# Patient Record
Sex: Male | Born: 1985 | Race: Black or African American | Hispanic: No | Marital: Single | Smoking: Current every day smoker
Health system: Southern US, Community
[De-identification: ages and names within clinical notes are randomized; demographics above are authoritative.]

## PROBLEM LIST (undated history)

## (undated) DIAGNOSIS — Q249 Congenital malformation of heart, unspecified: Secondary | ICD-10-CM

## (undated) HISTORY — PX: OTHER SURGICAL HISTORY: SHX169

---

## 2005-05-09 ENCOUNTER — Emergency Department: Payer: Self-pay | Admitting: General Practice

## 2005-05-14 ENCOUNTER — Emergency Department: Payer: Self-pay | Admitting: Emergency Medicine

## 2006-10-18 IMAGING — CR ORBITS - COMPLETE 4+ VIEW
1 series · 4 of 4 positions shown · non-contrast
Comparison: none

REASON FOR EXAM: left eye laceration
COMMENTS:

PROCEDURE:     DXR - DXR ORBITS COMPLETE  - May 10, 2005 [DATE]
RESULT:     The patient sustained laceration around the LEFT eye.
Four views of the orbits reveal no evidence of a retained foreign body. The
bony structures are grossly normal but the films are somewhat
underpenetrated.

[Series 1: view not recorded · 0.17mm/px · 4 of 4 slices shown]
[im 1/4]
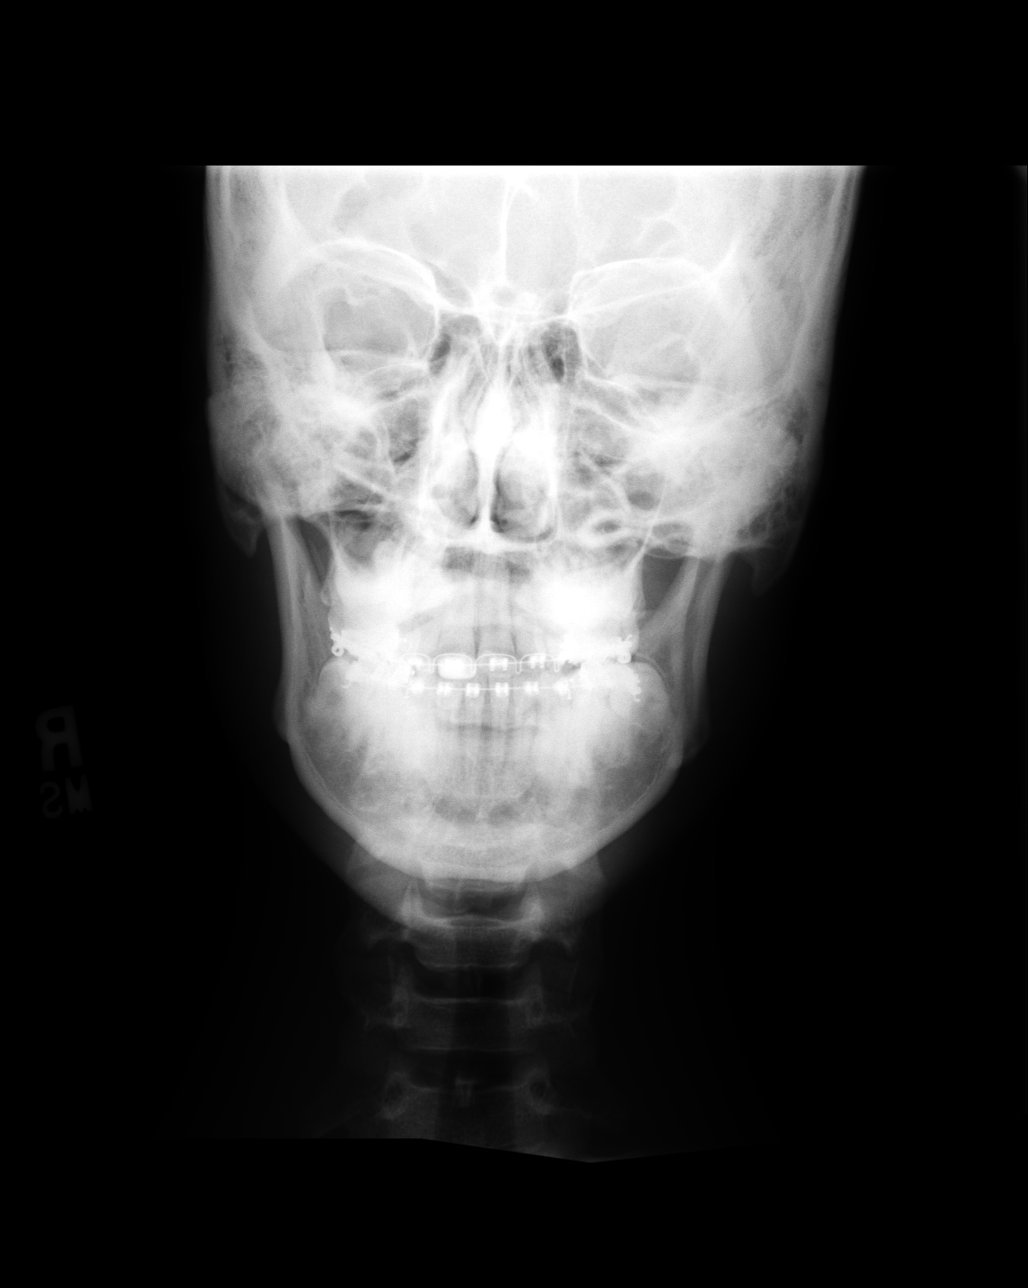
[im 2/4]
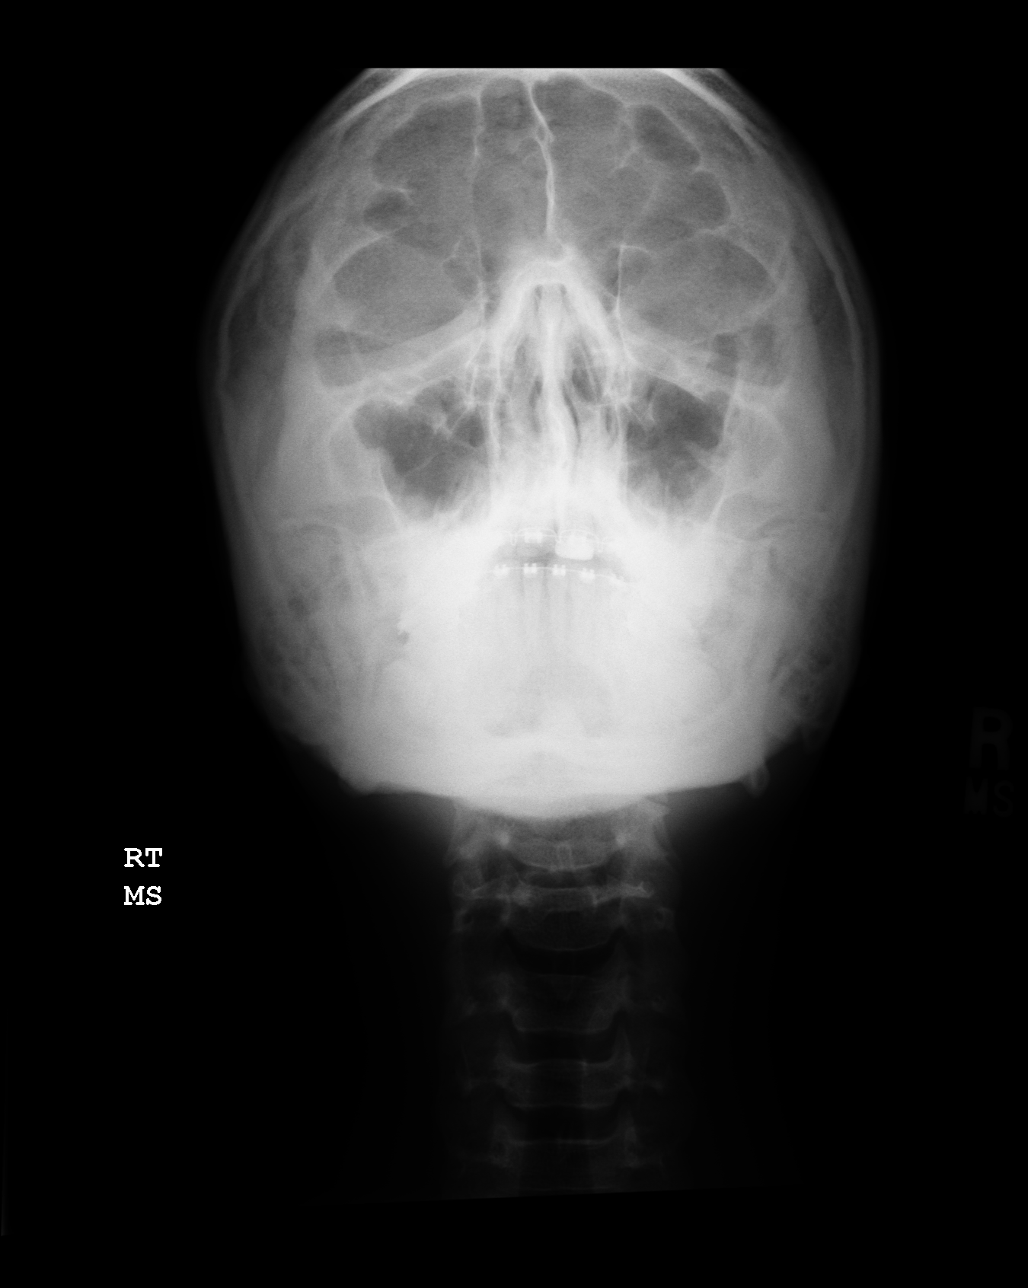
[im 3/4]
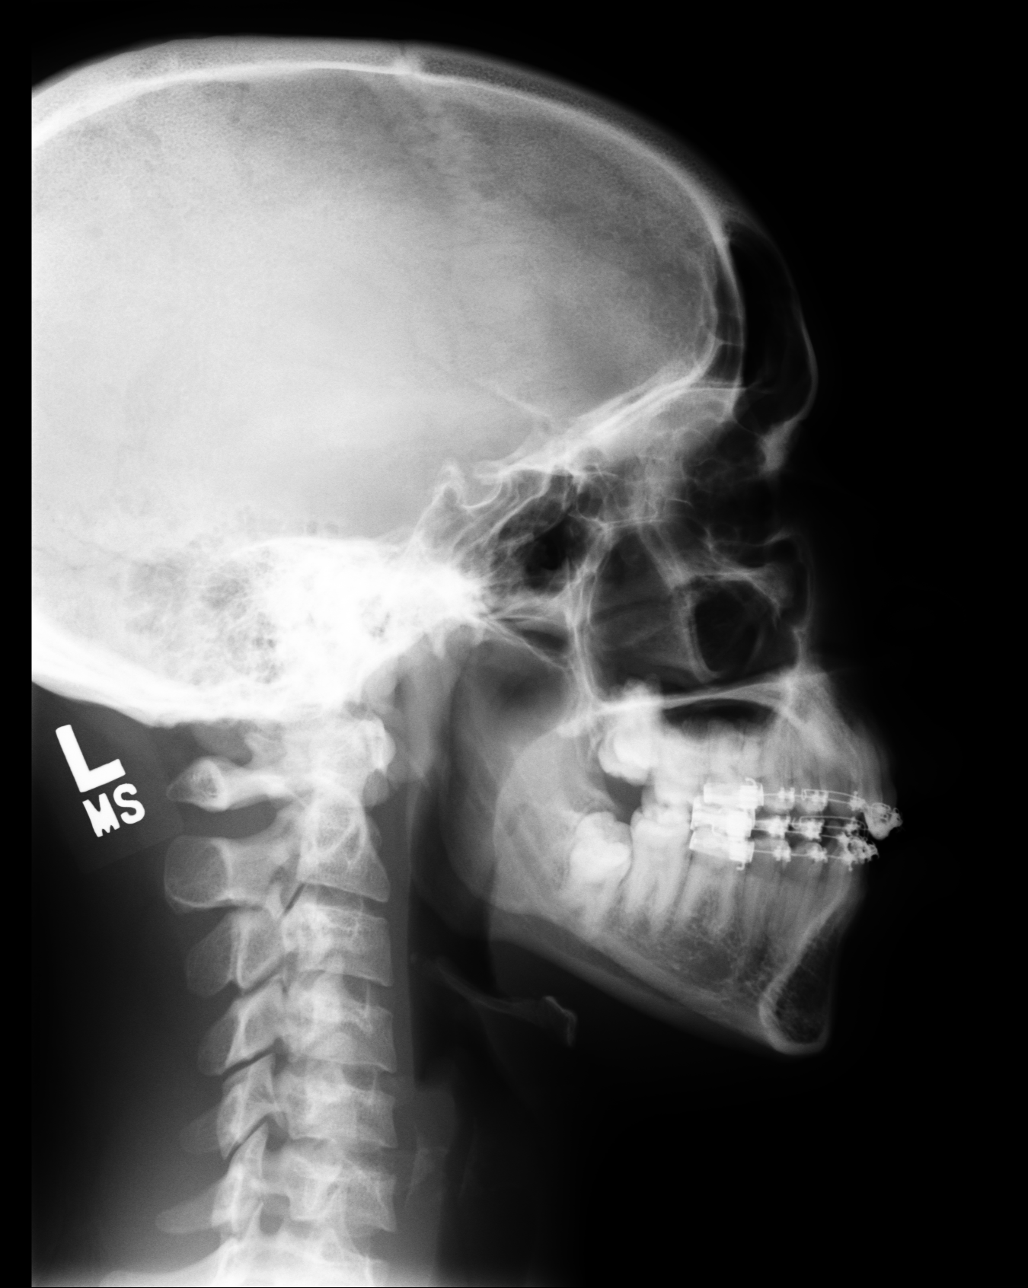
[im 4/4]
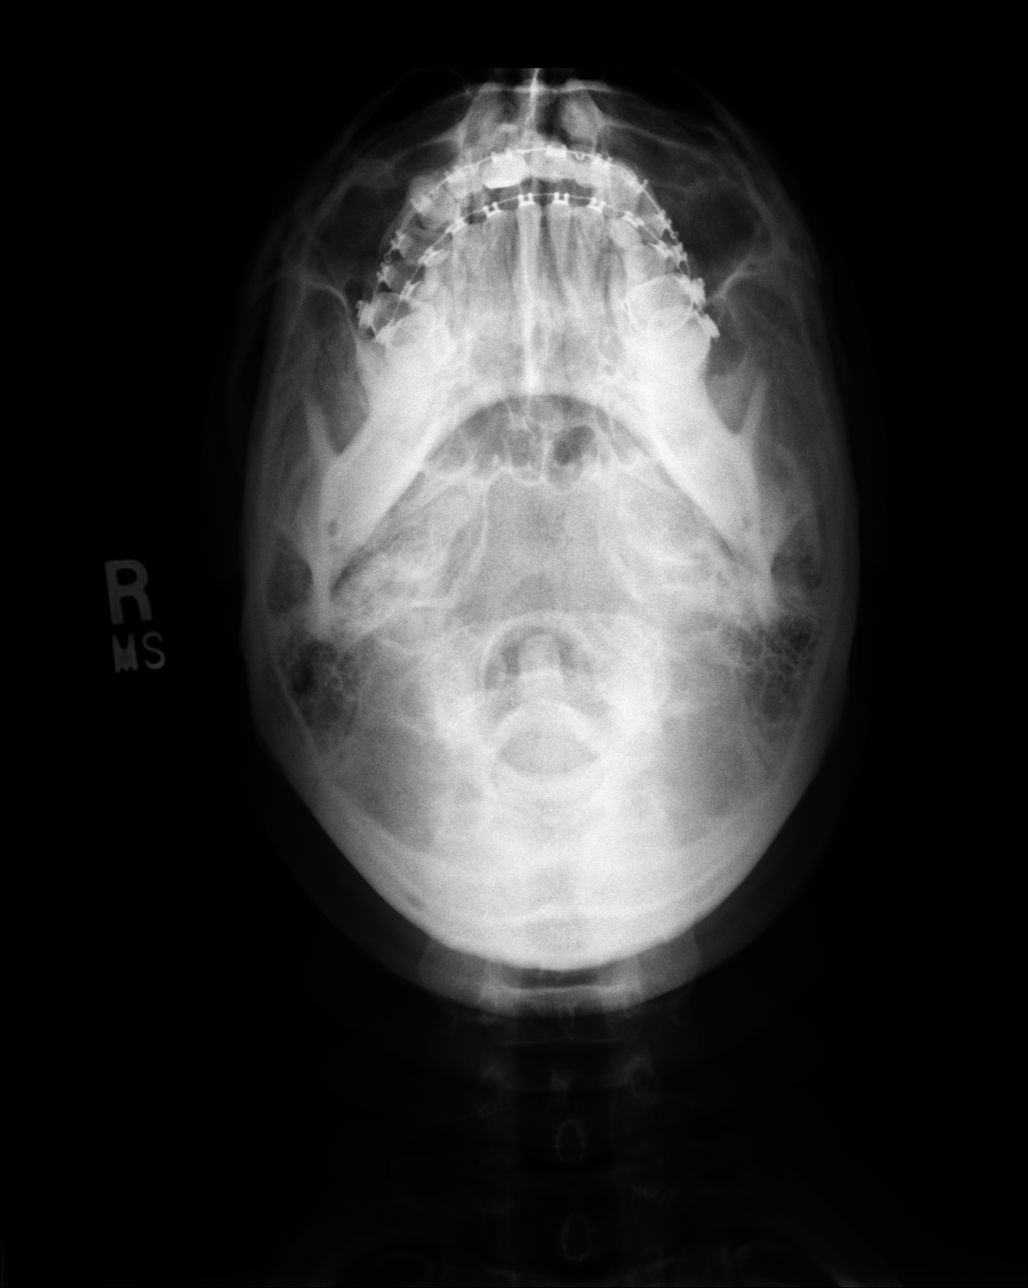

[4 of 4 positions shown; findings below may reference images not displayed]

IMPRESSION: 1)I do not see objective evidence of a fracture around the eyes nor evidence
of retained foreign bodies.  Further evaluation with CT scanning is
available if there are clinical findings which suggest an occult fracture.

## 2008-03-03 ENCOUNTER — Emergency Department: Payer: Self-pay | Admitting: Emergency Medicine

## 2013-03-22 ENCOUNTER — Emergency Department: Payer: Self-pay | Admitting: Emergency Medicine

## 2014-08-30 IMAGING — CR LEFT WRIST - COMPLETE 3+ VIEW
1 series · 4 of 4 positions shown · non-contrast
Comparison: none

REASON FOR EXAM: fall onto outstretched left hand
COMMENTS:

[Series 1: pa · 0.17mm/px · 4 of 4 slices shown]
[im 1/4]
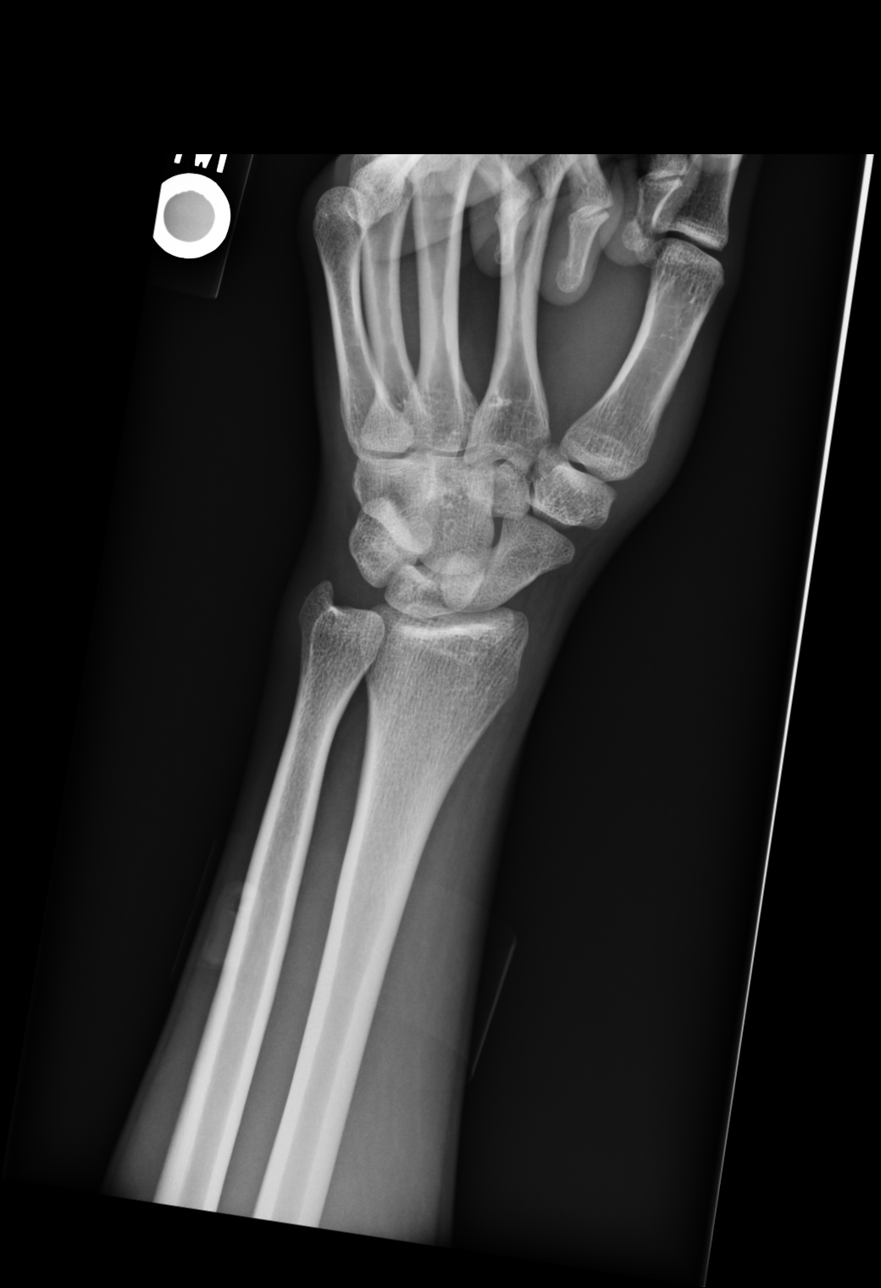
[im 2/4]
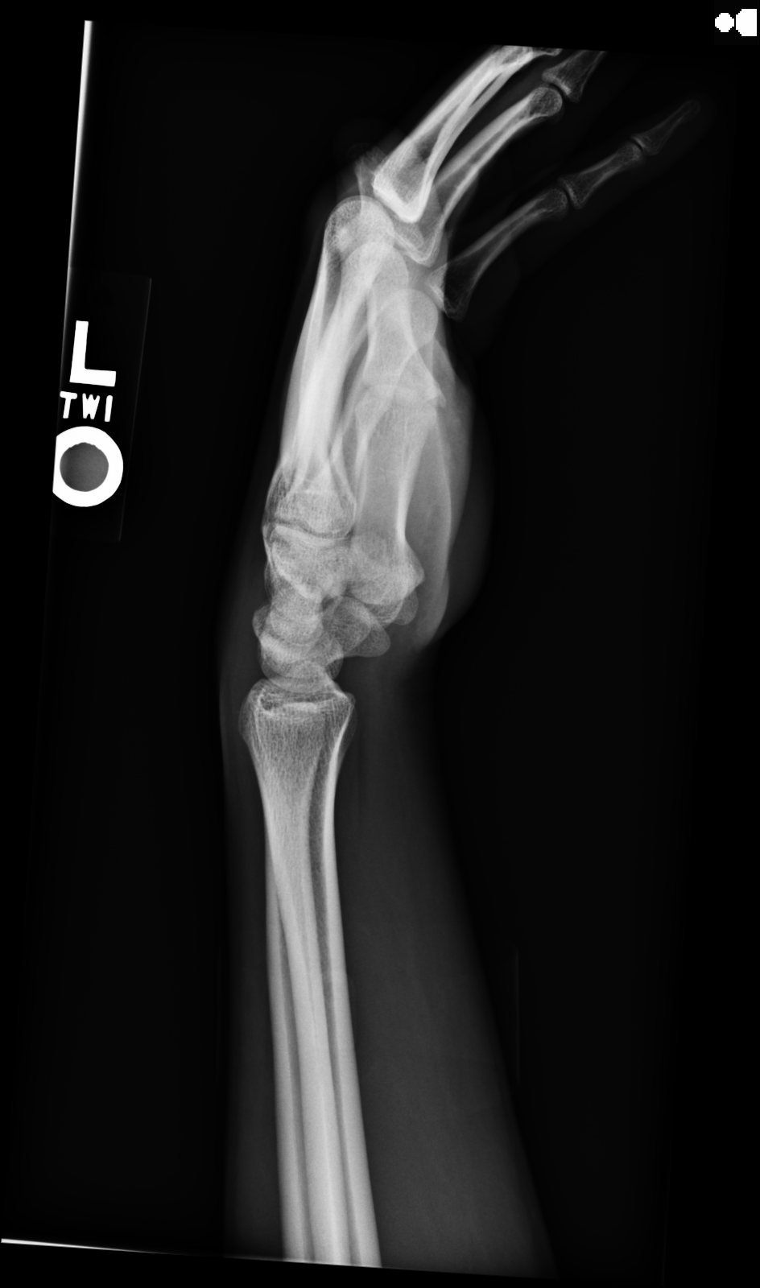
[im 3/4]
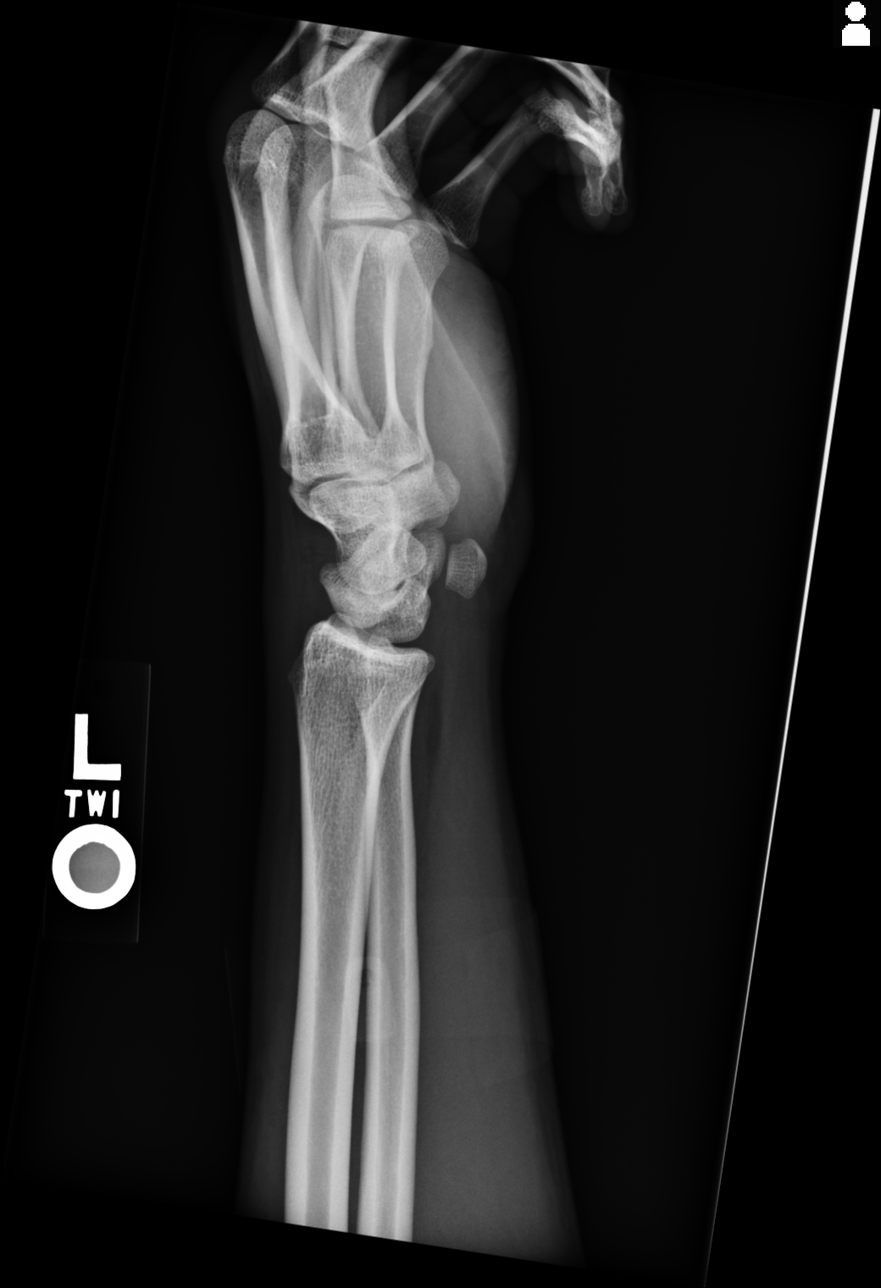
[im 4/4]
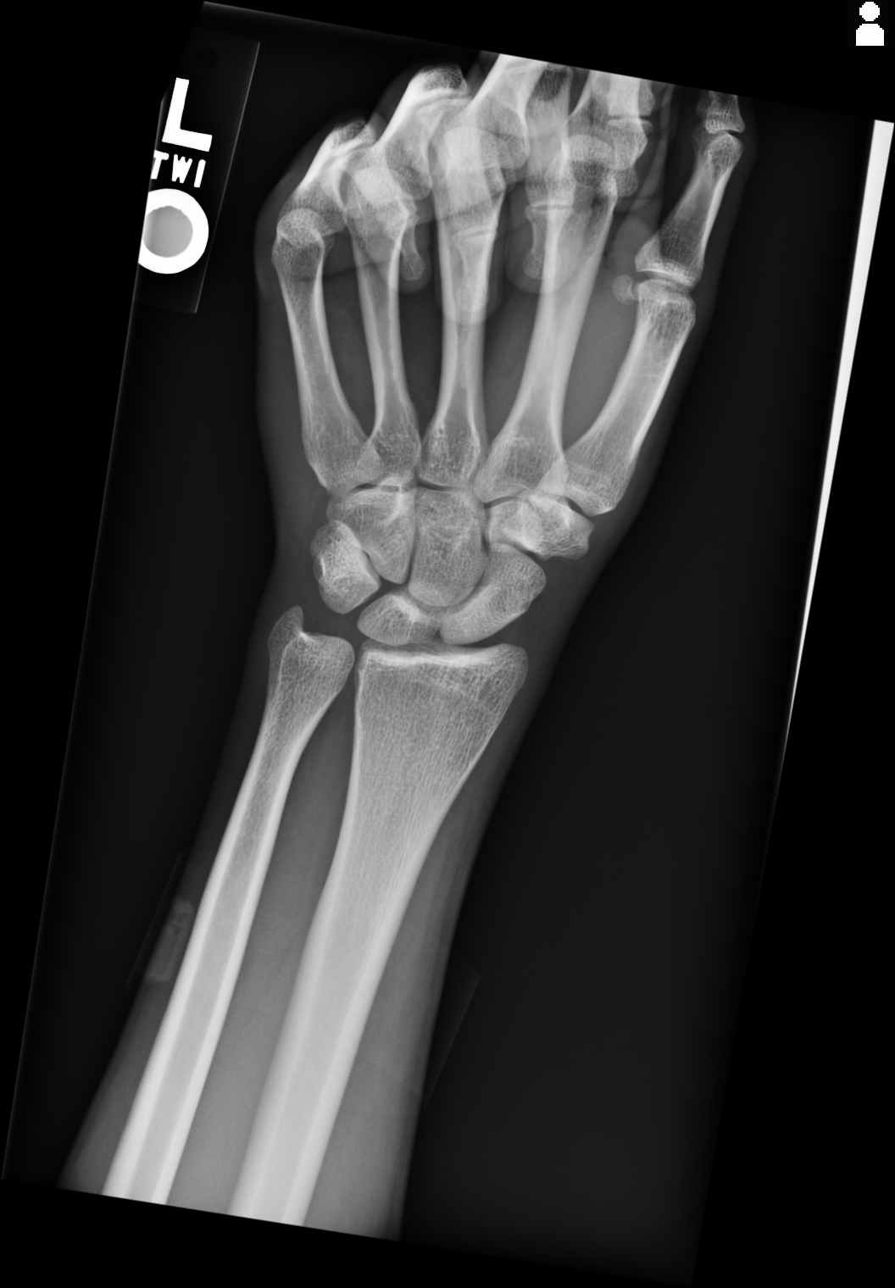

[4 of 4 positions shown; findings below may reference images not displayed]

PROCEDURE:     DXR - DXR WRIST LT COMP WITH OBLIQUES  - March 22, 2013  [DATE]

RESULT:     Four views of the left wrist reveal the bones to be adequately
mineralized. There is no evidence of an acute fracture or dislocation. No
significant degenerative change is demonstrated. The distal radius and ulna
appear normal. The overlying soft tissues are normal in appearance.
IMPRESSION: There is no acute bony abnormality of the left wrist.

If the patient's symptoms persist and remain unexplained, further evaluation
with MRI may be useful.

[REDACTED]

## 2016-05-12 ENCOUNTER — Encounter: Payer: Self-pay | Admitting: Emergency Medicine

## 2016-05-12 ENCOUNTER — Emergency Department
Admission: EM | Admit: 2016-05-12 | Discharge: 2016-05-12 | Disposition: A | Discharge status: Home / Self Care | Payer: Self-pay | Attending: Emergency Medicine | Admitting: Emergency Medicine

## 2016-05-12 DIAGNOSIS — H6123 Impacted cerumen, bilateral: Secondary | ICD-10-CM | POA: Insufficient documentation

## 2016-05-12 DIAGNOSIS — F17210 Nicotine dependence, cigarettes, uncomplicated: Secondary | ICD-10-CM | POA: Insufficient documentation

## 2016-05-12 HISTORY — DX: Congenital malformation of heart, unspecified: Q24.9

## 2016-05-12 MED ORDER — CARBAMIDE PEROXIDE 6.5 % OT SOLN: 5.0000 [drp] | Freq: Two times a day (BID) | OTIC | Status: DC

## 2016-05-12 NOTE — Discharge Instructions (Signed)
Cerumen Impaction The structures of the external ear canal secrete a waxy substance known as cerumen. Excess cerumen can build up in the ear canal, causing a condition known as cerumen impaction. Cerumen impaction can cause ear pain and disrupt the function of the ear. The rate of cerumen production differs for each individual. In certain individuals, the configuration of the ear canal may decrease his or her ability to naturally remove cerumen. CAUSES Cerumen impaction is caused by excessive cerumen production or buildup. RISK FACTORS  Frequent use of swabs to clean ears.  Having narrow ear canals.  Having eczema.  Being dehydrated. SIGNS AND SYMPTOMS  Diminished hearing.  Ear drainage.  Ear pain.  Ear itch. TREATMENT Treatment may involve:  Over-the-counter or prescription ear drops to soften the cerumen.  Removal of cerumen by a health care provider. This may be done with:  Irrigation with warm water. This is the most common method of removal.  Ear curettes and other instruments.  Surgery. This may be done in severe cases. HOME CARE INSTRUCTIONS  Take medicines only as directed by your health care provider.  Do not insert objects into the ear with the intent of cleaning the ear. PREVENTION  Do not insert objects into the ear, even with the intent of cleaning the ear. Removing cerumen as a part of normal hygiene is not necessary, and the use of swabs in the ear canal is not recommended.  Drink enough water to keep your urine clear or pale yellow.  Control your eczema if you have it. SEEK MEDICAL CARE IF:  You develop ear pain.  You develop bleeding from the ear.  The cerumen does not clear after you use ear drops as directed.   This information is not intended to replace advice given to you by your health care provider. Make sure you discuss any questions you have with your health care provider.   Document Released: 11/27/2004 Document Revised: 11/10/2014  Document Reviewed: 06/06/2015 Elsevier Interactive Patient Education 2016 [employer]  

## 2016-05-12 NOTE — ED Notes (Signed)
See triage note  States he felt like he has water or wax build up in both ears for the past couple of days

## 2016-05-12 NOTE — ED Provider Notes (Signed)
[facility] Emergency Department Provider Note   ____________________________________________  Time seen: Approximately 12:42 PM  I have reviewed the triage vital signs and the nursing notes.   HISTORY  Chief Complaint Hearing Problem    HPI Ryan Frye is a 30 y.o. male patient complaining of bilateral hearing loss since yesterday. Patient states "feeling like water is in his ears".Patient denies any URI signs and symptoms. Patient denies pain with this complaint. No palliative measures for his complaint.   Past Medical History  Diagnosis Date  . Congenital heart defect     There are no active problems to display for this patient.   Past Surgical History  Procedure Laterality Date  . Open heart surgery      No current outpatient prescriptions on file.  Allergies Review of patient's allergies indicates no known allergies.  No family history on file.  Social History Social History  Substance Use Topics  . Smoking status: Current Every Day Smoker -- 0.50 packs/day    Types: Cigarettes  . Smokeless tobacco: None  . Alcohol Use: No    Review of Systems Constitutional: No fever/chills Eyes: No visual changes. ENT: No sore throat.  Bilateral hearing loss Cardiovascular: Denies chest pain. Respiratory: Denies shortness of breath. Gastrointestinal: No abdominal pain.  No nausea, no vomiting.  No diarrhea.  No constipation. Genitourinary: Negative for dysuria. Musculoskeletal: Negative for back pain. Skin: Negative for rash. Neurological: Negative for headaches, focal weakness or numbness.   ____________________________________________   PHYSICAL EXAM:  VITAL SIGNS: ED Triage Vitals  Enc Vitals Group     BP 05/12/16 1231 129/79 mmHg     Pulse Rate 05/12/16 1231 93     Resp 05/12/16 1231 20     Temp 05/12/16 1231 98.4 F (36.9 C)     Temp Source 05/12/16 1231 Oral     SpO2 05/12/16 1231 94 %     Weight 05/12/16 1231  175 lb (79.379 kg)     Height 05/12/16 1231 5\' 6"  (1.676 m)     Head Cir --      Peak Flow --      Pain Score --      Pain Loc --      Pain Edu? --      Excl. in GC? --     Constitutional: Alert and oriented. Well appearing and in no acute distress. Eyes: Conjunctivae are normal. PERRL. EOMI. Head: Atraumatic. Nose: No congestion/rhinnorhea. EARS: Canals full of cerumen. TMs not visible Mouth/Throat: Mucous membranes are moist.  Oropharynx non-erythematous. Neck: No stridor.  No cervical spine tenderness to palpation. Hematological/Lymphatic/Immunilogical: No cervical lymphadenopathy. Cardiovascular: Normal rate, regular rhythm. Grossly normal heart sounds.  Good peripheral circulation. Respiratory: Normal respiratory effort.  No retractions. Lungs CTAB. Gastrointestinal: Soft and nontender. No distention. No abdominal bruits. No CVA tenderness. Musculoskeletal: No lower extremity tenderness nor edema.  No joint effusions. Neurologic:  Normal speech and language. No gross focal neurologic deficits are appreciated. No gait instability. Skin:  Skin is warm, dry and intact. No rash noted. Psychiatric: Mood and affect are normal. Speech and behavior are normal.  ____________________________________________   LABS (all labs ordered are listed, but only abnormal results are displayed)  Labs Reviewed - No data to display ____________________________________________  EKG   ____________________________________________  RADIOLOGY   ____________________________________________   PROCEDURES  Procedure(s) performed: None  Procedures  Critical Care performed: No  ____________________________________________   INITIAL IMPRESSION / ASSESSMENT AND PLAN / ED COURSE  Pertinent  labs & imaging results that were available during my care of the patient were reviewed by me and considered in my medical decision making (see chart for details).  Cerumen impaction. Patient bilateral  irrigation to remove cerumen. Patient given discharge care instruction and [facility].Patient given prescription for Debrox ear drops. ____________________________________________   FINAL CLINICAL IMPRESSION(S) / ED DIAGNOSES  Final diagnoses:  Cerumen impaction, bilateral      NEW MEDICATIONS STARTED DURING THIS VISIT:  New Prescriptions   No medications on file     Note:  This document was prepared using Dragon voice recognition software and may include unintentional dictation errors.    Joni Reining, PA-C 05/12/16 1329  Joni Reining, PA-C 05/12/16 1411  Jene Every, MD 05/12/16 [phone]

## 2016-05-12 NOTE — ED Notes (Signed)
States has had decreased hearing since yesterday, L worse than R, states "fells like I have water in my ears"

## 2017-02-19 ENCOUNTER — Emergency Department
Admission: EM | Admit: 2017-02-19 | Discharge: 2017-02-19 | Disposition: A | Discharge status: Home / Self Care | Payer: Self-pay | Attending: Emergency Medicine | Admitting: Emergency Medicine

## 2017-02-19 DIAGNOSIS — F17210 Nicotine dependence, cigarettes, uncomplicated: Secondary | ICD-10-CM | POA: Insufficient documentation

## 2017-02-19 DIAGNOSIS — H1031 Unspecified acute conjunctivitis, right eye: Secondary | ICD-10-CM | POA: Insufficient documentation

## 2017-02-19 DIAGNOSIS — Z79899 Other long term (current) drug therapy: Secondary | ICD-10-CM | POA: Insufficient documentation

## 2017-02-19 MED ORDER — ACETAMINOPHEN 325 MG PO TABS
650.0000 mg | ORAL_TABLET | Freq: Once | ORAL | Status: AC
  Administered 2017-02-19: 650 mg via ORAL

## 2017-02-19 MED ORDER — ERYTHROMYCIN 5 MG/GM OP OINT: TOPICAL_OINTMENT | Freq: Three times a day (TID) | OPHTHALMIC | 0 refills | Status: AC

## 2017-02-19 NOTE — ED Triage Notes (Signed)
Pt c/o right eye irritation today with drainage.Ryan Frye

## 2017-02-19 NOTE — ED Provider Notes (Signed)
[facility] Emergency Department Provider Note  ____________________________________________  Time seen: Approximately 3:30 PM  I have reviewed the triage vital signs and the nursing notes.   HISTORY  Chief Complaint Eye Problem    HPI Ryan Frye is a 31 y.o. male that presents to the emergency department with a red eye and excessive clear drainage since this morning. Patient states that he woke up this morning with the red eye. He also states that his cheek is hurting near his eye and he has a mild headache.He does not wear contacts or glasses and does not see an eye doctor. He has never had problems with his eyes in the past. No trauma. He denies fever, visual changes, double vision, floaters, flashers, photophobia, eye pain, shortness of breath, chest pain, nausea, vomiting.   Past Medical History:  Diagnosis Date  . Congenital heart defect     There are no active problems to display for this patient.   Past Surgical History:  Procedure Laterality Date  . open heart surgery      Prior to Admission medications   Medication Sig Start Date End Date Taking? Authorizing Provider  carbamide peroxide (DEBROX) 6.5 % otic solution Place 5 drops into the right ear 2 (two) times daily. 05/12/16   Joni Reining, PA-C  erythromycin [facility]) ophthalmic ointment Place into the right eye 3 (three) times daily. Place a 1/2 inch ribbon of ointment into the lower eyelid. 02/19/17 03/01/17  Enid Derry, PA-C    Allergies Patient has no known allergies.  No family history on file.  Social History Social History  Substance Use Topics  . Smoking status: Current Every Day Smoker    Packs/day: 0.50    Types: Cigarettes  . Smokeless tobacco: Never Used  . Alcohol use No     Review of Systems  Constitutional: No fever/chills ENT: No upper respiratory complaints. Cardiovascular: No chest pain. Respiratory: No cough. No SOB. Gastrointestinal:  No  nausea, no vomiting.    ____________________________________________   PHYSICAL EXAM:  VITAL SIGNS: ED Triage Vitals  Enc Vitals Group     BP 02/19/17 1440 129/72     Pulse Rate 02/19/17 1440 81     Resp 02/19/17 1440 18     Temp 02/19/17 1440 98.8 F (37.1 C)     Temp Source 02/19/17 1440 Oral     SpO2 02/19/17 1440 98 %     Weight 02/19/17 1440 165 lb (74.8 kg)     Height 02/19/17 1440  (1.676 m)     Head Circumference --      Peak Flow --      Pain Score 02/19/17 1431 3     Pain Loc --      Pain Edu? --      Excl. in GC? --      Constitutional: Alert and oriented. Well appearing and in no acute distress. Eyes: Conjunctiva is injected. Excess clear drainage in right eye. PERRL. EOMI. Head: Atraumatic. No temporal tenderness. ENT:      Ears: Tympanic membranes are occluded with cerumen.      Nose: No congestion/rhinnorhea. No sinus tenderness.      Mouth/Throat: Mucous membranes are moist.  Neck: No stridor.  Cardiovascular: Normal rate, regular rhythm.  Good peripheral circulation. Respiratory: Normal respiratory effort without tachypnea or retractions. Lungs CTAB. Good air entry to the bases with no decreased or absent breath sounds. Musculoskeletal: Full range of motion to all extremities. No gross deformities  appreciated. Neurologic:  Normal speech and language. No gross focal neurologic deficits are appreciated.  Skin:  Skin is warm, dry and intact. No rash noted.  ____________________________________________   LABS (all labs ordered are listed, but only abnormal results are displayed)  Labs Reviewed - No data to display ____________________________________________  EKG   ____________________________________________  RADIOLOGY   No results found.  ____________________________________________    PROCEDURES  Procedure(s) performed:    Procedures   INITIAL IMPRESSION / ASSESSMENT AND PLAN / ED COURSE  Pertinent labs & imaging results  that were available during my care of the patient were reviewed by me and considered in my medical decision making (see chart for details).  Review of the [place] CSRS was performed in accordance of the [facility] prior to dispensing any controlled drugs.     Patient's diagnosis is consistent with conjunctivitis. Vital signs and exam are reassuring. No decrease in visual acuity. No eye pain. Patient will be discharged home with prescriptions for Romycin ointment. Patient is to follow up with eye center as directed. Patient is given ED precautions to return to the ED for any worsening or new symptoms.   ____________________________________________  FINAL CLINICAL IMPRESSION(S) / ED DIAGNOSES  Final diagnoses:  Acute conjunctivitis of right eye, unspecified acute conjunctivitis type      NEW MEDICATIONS STARTED DURING THIS VISIT:  Discharge Medication List as of 02/19/2017  3:59 PM    START taking these medications   Details  erythromycin (ROMYCIN) ophthalmic ointment Place into the right eye 3 (three) times daily. Place a 1/2 inch ribbon of ointment into the lower eyelid., Starting Thu 02/19/2017, Until Sun 03/01/2017, Print            This chart was dictated using voice recognition software/Dragon. Despite best efforts to proofread, errors can occur which can change the meaning. Any change was purely unintentional.    Enid Derry, PA-C 02/19/17 1646    Myrna Blazer, MD 02/19/17 [phone]

## 2018-11-09 ENCOUNTER — Encounter: Payer: Self-pay | Admitting: Medical Oncology

## 2018-11-09 ENCOUNTER — Emergency Department
Admission: EM | Admit: 2018-11-09 | Discharge: 2018-11-09 | Disposition: A | Discharge status: Home / Self Care | Payer: Self-pay | Attending: Emergency Medicine | Admitting: Emergency Medicine

## 2018-11-09 DIAGNOSIS — F17210 Nicotine dependence, cigarettes, uncomplicated: Secondary | ICD-10-CM | POA: Insufficient documentation

## 2018-11-09 DIAGNOSIS — J069 Acute upper respiratory infection, unspecified: Secondary | ICD-10-CM | POA: Insufficient documentation

## 2018-11-09 MED ORDER — ONDANSETRON 4 MG PO TBDP: 4.0000 mg | ORAL_TABLET | Freq: Three times a day (TID) | ORAL | 0 refills | Status: DC | PRN

## 2018-11-09 MED ORDER — GUAIFENESIN-CODEINE 100-10 MG/5ML PO SOLN: 10.0000 mL | Freq: Three times a day (TID) | ORAL | 0 refills | Status: DC | PRN

## 2018-11-09 NOTE — Discharge Instructions (Signed)
Take tylenol or ibuprofen for headache or pain.  Follow up with the primary care provider of your choice for symptoms of concern if unable to schedule an appointment.

## 2018-11-09 NOTE — ED Notes (Signed)
See triage note  Presents with body aches and cough/congenstion for 2 days   Low grade fever

## 2018-11-09 NOTE — ED Triage Notes (Signed)
Pt reports flu like sx's that began 2 days ago.

## 2018-11-09 NOTE — ED Provider Notes (Signed)
[facility] Emergency Department Provider Note  ____________________________________________  Time seen: Approximately 4:27 PM  I have reviewed the triage vital signs and the nursing notes.   HISTORY  Chief Complaint Influenza   HPI Ryan Frye is a 33 y.o. male who presents to the emergency department for treatment and evaluation of flu like symptoms that started yesterday. He complains of congestion, sore throat, sneezing, fever, and chills with intermittent vomiting. No body aches with the exception of back pain with cough.   Past Medical History:  Diagnosis Date  . Congenital heart defect     There are no active problems to display for this patient.   Past Surgical History:  Procedure Laterality Date  . open heart surgery      Prior to Admission medications   Medication Sig Start Date End Date Taking? Authorizing Provider  carbamide peroxide (DEBROX) 6.5 % otic solution Place 5 drops into the right ear 2 (two) times daily. 05/12/16   Joni ReiningSmith, Ronald K, PA-C  guaiFENesin-codeine 100-10 MG/5ML syrup Take 10 mLs by mouth 3 (three) times daily as needed. 11/09/18   Rayhana Slider B, FNP  ondansetron (ZOFRAN-ODT) 4 MG disintegrating tablet Take 1 tablet (4 mg total) by mouth every 8 (eight) hours as needed for nausea or vomiting. 11/09/18   Chinita Pesterriplett, Iren Whipp B, FNP    Allergies Patient has no known allergies.  No family history on file.  Social History Social History   Tobacco Use  . Smoking status: Current Every Day Smoker    Packs/day: 0.50    Types: Cigarettes  . Smokeless tobacco: Never Used  Substance Use Topics  . Alcohol use: No  . Drug use: Not on file    Review of Systems Constitutional: Positive for fever/chills.  appetite. ENT: Positive for sore throat. Cardiovascular: Denies chest pain. Respiratory: Negative for shortness of breath. Positive for cough. Negative for wheezing.  Gastrointestinal: Positive for nausea,  Positive for  vomiting.  Negative for diarrhea.  Musculoskeletal: Negative for body aches Skin: Negative for rash. Neurological: Positive for headaches ____________________________________________   PHYSICAL EXAM:  VITAL SIGNS: ED Triage Vitals  Enc Vitals Group     BP 11/09/18 1617 126/72     Pulse Rate 11/09/18 1617 98     Resp 11/09/18 1617 18     Temp 11/09/18 1617 99 F (37.2 C)     Temp Source 11/09/18 1617 Oral     SpO2 11/09/18 1617 98 %     Weight 11/09/18 1613 175 lb (79.4 kg)     Height 11/09/18 1613 5\' 6"  (1.676 m)     Head Circumference --      Peak Flow --      Pain Score 11/09/18 1613 7     Pain Loc --      Pain Edu? --      Excl. in GC? --     Constitutional: Alert and oriented. Well appearing and in no acute distress. Eyes: Conjunctivae are normal. Ears: Bilateral TM normal. Nose: Negative for sinus congestion noted; no rhinnorhea. Mouth/Throat: Mucous membranes are moist.  Oropharynx erythematous. Tonsils erythematous. Uvula midline. Neck: No stridor.  Lymphatic: No cervical lymphadenopathy. Cardiovascular: Normal rate, regular rhythm. Good peripheral circulation. Respiratory: Respirations are even and unlabored.  No retractions. Breath sounds clear to auscultation. Gastrointestinal: Soft and nontender.  Musculoskeletal: FROM x 4 extremities.  Neurologic:  Normal speech and language. Skin:  Skin is warm, dry and intact. No rash noted. Psychiatric: Mood and affect are  normal. Speech and behavior are normal.  ____________________________________________   LABS (all labs ordered are listed, but only abnormal results are displayed)  Labs Reviewed - No data to display ____________________________________________  EKG  Not indicated. ____________________________________________  RADIOLOGY  Not indicated. ____________________________________________   PROCEDURES  Procedure(s) performed: None  Critical Care performed:  No ____________________________________________   INITIAL IMPRESSION / ASSESSMENT AND PLAN / ED COURSE  33 y.o. male presents to the emergency department for treatment and evaluation of body aches and cough with congestion for the past 2 days.  Symptoms and exam are most consistent with a viral URI.  He will be treated with Robitussin-AC and Zofran.  He is to call and schedule follow-up appointment with primary care if not improving over the week or so.  He was encouraged to return to the emergency department for symptoms of change or worsen if unable to schedule appointment.    Medications - No data to display  ED Discharge Orders         Ordered    guaiFENesin-codeine 100-10 MG/5ML syrup  3 times daily PRN     11/09/18 1710    ondansetron (ZOFRAN-ODT) 4 MG disintegrating tablet  Every 8 hours PRN     11/09/18 1710           Pertinent labs & imaging results that were available during my care of the patient were reviewed by me and considered in my medical decision making (see chart for details).    If controlled substance prescribed during this visit, 12 month history viewed on the [place] prior to issuing an initial prescription for Schedule II or III opiod. ____________________________________________   FINAL CLINICAL IMPRESSION(S) / ED DIAGNOSES  Final diagnoses:  Viral upper respiratory tract infection    Note:  This document was prepared using Dragon voice recognition software and may include unintentional dictation errors.     Chinita Pesterriplett, Khiana Camino B, FNP 11/10/18 2200    Dionne BucySiadecki, Sebastian, MD 11/10/18 2351

## 2018-12-16 ENCOUNTER — Emergency Department: Admission: EM | Admit: 2018-12-16 | Discharge: 2018-12-16 | Discharge status: Against Medical Advice | Payer: Self-pay

## 2018-12-16 NOTE — ED Notes (Signed)
No answer when called several times from lobby 

## 2019-06-30 ENCOUNTER — Other Ambulatory Visit: Payer: Self-pay | Admitting: *Deleted

## 2019-06-30 DIAGNOSIS — Z20822 Contact with and (suspected) exposure to covid-19: Secondary | ICD-10-CM

## 2019-07-01 LAB — NOVEL CORONAVIRUS, NAA: SARS-CoV-2, NAA: NOT DETECTED

## 2020-10-05 ENCOUNTER — Ambulatory Visit: Payer: Self-pay

## 2020-10-09 ENCOUNTER — Ambulatory Visit: Payer: Self-pay | Admitting: Physician Assistant

## 2020-10-09 ENCOUNTER — Other Ambulatory Visit: Payer: Self-pay

## 2020-10-09 DIAGNOSIS — Z113 Encounter for screening for infections with a predominantly sexual mode of transmission: Secondary | ICD-10-CM

## 2020-10-09 LAB — GRAM STAIN

## 2020-10-09 NOTE — Progress Notes (Signed)
Patient here for STD testing.[name], RN 

## 2020-10-09 NOTE — Progress Notes (Signed)
Post:  RN reviewed gram stain with patient. No tx needed per S.O. Provider orders complete.   Kashlynn Kundert, RN  

## 2020-10-10 ENCOUNTER — Encounter: Payer: Self-pay | Admitting: Physician Assistant

## 2020-10-10 NOTE — Progress Notes (Signed)
   [facility] Department STI clinic/screening visit  Subjective:  Ryan Frye is a 34 y.o. male being seen today for an STI screening visit. The patient reports they do not have symptoms.    Patient has the following medical conditions:  There are no problems to display for this patient.    Chief Complaint  Patient presents with  . SEXUALLY TRANSMITTED DISEASE    screening    HPI  Patient reports that he is not having any symptoms but would like a screening today.  Denies chronic conditions, regular medicines and previous HIV testing.  States had open heart surgery at 5 mo old to correct a defect.  States that his last void prior to sample collection for Gram stain was about 2 hr ago.     See flowsheet for further details and programmatic requirements.    The following portions of the patient's history were reviewed and updated as appropriate: allergies, current medications, past medical history, past social history, past surgical history and problem list.  Objective:  There were no vitals filed for this visit.  Physical Exam Constitutional:      General: He is not in acute distress.    Appearance: Normal appearance.  HENT:     Head: Normocephalic and atraumatic.     Comments: No nits,lice, or hair loss. No cervical, supraclavicular or axillary adenopathy.    Mouth/Throat:     Mouth: Mucous membranes are moist.     Pharynx: Oropharynx is clear. No oropharyngeal exudate or posterior oropharyngeal erythema.  Eyes:     Conjunctiva/sclera: Conjunctivae normal.  Pulmonary:     Effort: Pulmonary effort is normal.  Abdominal:     Palpations: Abdomen is soft. There is no mass.     Tenderness: There is no abdominal tenderness. There is no guarding or rebound.  Genitourinary:    Penis: Normal.      Testes: Normal.     Comments: Pubic area without nits, lice, hair loss, edema, erythema, lesions and inguinal adenopathy. Penis circumcised without rash, lesions  and discharge at meatus. Musculoskeletal:     Cervical back: Neck supple. No tenderness.  Skin:    General: Skin is warm and dry.     Findings: No bruising, erythema, lesion or rash.  Neurological:     Mental Status: He is alert and oriented to person, place, and time.  Psychiatric:        Mood and Affect: Mood normal.        Behavior: Behavior normal.        Thought Content: Thought content normal.        Judgment: Judgment normal.       Assessment and Plan:  Keinan L Borgerding is a 34 y.o. male presenting to the [facility] Department for STI screening  1. Screening for STD (sexually transmitted disease) Patient into clinic without symptoms. Rec condoms with all sex. Await test results.  Counseled that RN will call if needs to RTC for treatment once results are back. - Gram stain - Gonococcus culture - HIV [place] [facility] - Syphilis Serology, [place]     No follow-ups on file.  No future appointments.  Matt Holmes, PA

## 2020-10-14 LAB — GONOCOCCUS CULTURE

## 2020-12-10 ENCOUNTER — Encounter: Payer: Self-pay | Admitting: Emergency Medicine

## 2020-12-10 ENCOUNTER — Emergency Department
Admission: EM | Admit: 2020-12-10 | Discharge: 2020-12-10 | Disposition: A | Discharge status: Home / Self Care | Payer: Self-pay | Attending: Emergency Medicine | Admitting: Emergency Medicine

## 2020-12-10 ENCOUNTER — Other Ambulatory Visit: Payer: Self-pay

## 2020-12-10 DIAGNOSIS — Z202 Contact with and (suspected) exposure to infections with a predominantly sexual mode of transmission: Secondary | ICD-10-CM

## 2020-12-10 DIAGNOSIS — F17210 Nicotine dependence, cigarettes, uncomplicated: Secondary | ICD-10-CM | POA: Insufficient documentation

## 2020-12-10 DIAGNOSIS — B353 Tinea pedis: Secondary | ICD-10-CM | POA: Insufficient documentation

## 2020-12-10 DIAGNOSIS — R21 Rash and other nonspecific skin eruption: Secondary | ICD-10-CM

## 2020-12-10 MED ORDER — PENICILLIN G BENZATHINE 1200000 UNIT/2ML IM SUSP: 2.4000 10*6.[IU] | Freq: Once | INTRAMUSCULAR | Status: DC

## 2020-12-10 NOTE — ED Triage Notes (Signed)
Pt in w/R palmar foot pain x 2 days. States he has a lump underneath ball of foot, and skin is falling off of foot sole. Painful to walk, states red bumps present to area.

## 2020-12-10 NOTE — ED Notes (Signed)
See triage note  Presents with pain to right foot  States pain is mainly to great toe and under side of foot

## 2020-12-10 NOTE — ED Provider Notes (Signed)
[facility] Emergency Department Provider Note  ____________________________________________   Event Date/Time   First MD Initiated Contact with Patient 12/10/20 1554     (approximate)  I have reviewed the triage vital signs and the nursing notes.   HISTORY  Chief Complaint Foot Pain  HPI Ryan Frye is a 35 y.o. male reports to the emergency department for evaluation of rash to the bilateral feet.  Patient states that a few days ago, she was at work when his socks and shoes got very wet, however he had to finish his shift.  The following day, he states he wore 2 socks on each foot to try to alleviate this but still manages to get them wet.  He notes the following day that there was a raised area to his medial side of his right foot with associated rash.  He is also noticed some peeling on the plantar surface of both feet.  Patient was tested in December for syphilis and was nonreactive at that time.  However, he does report concern for exposure to STDs in the interim.  He denies any pain from his rash.  He denies any preceding penile lesions.  He has not tried any alleviating measures for his current rash.         Past Medical History:  Diagnosis Date  . Congenital heart defect     There are no problems to display for this patient.   Past Surgical History:  Procedure Laterality Date  . open heart surgery      Prior to Admission medications   Not on File    Allergies Patient has no known allergies.  No family history on file.  Social History Social History   Tobacco Use  . Smoking status: Current Every Day Smoker    Packs/day: 0.50    Types: Cigarettes  . Smokeless tobacco: Never Used  Substance Use Topics  . Alcohol use: Yes    Comment: occasionally  . Drug use: Not Currently    Types: Marijuana    Review of Systems Constitutional: No fever/chills Eyes: No visual changes. ENT: No sore throat. Cardiovascular: Denies chest  pain. Respiratory: Denies shortness of breath. Gastrointestinal: No abdominal pain.  No nausea, no vomiting.  No diarrhea.  No constipation. Genitourinary: Negative for dysuria. Musculoskeletal: Negative for back pain. Skin: + Bilateral foot rash Neurological: Negative for headaches, focal weakness or numbness.  ____________________________________________   PHYSICAL EXAM:  VITAL SIGNS: ED Triage Vitals  Enc Vitals Group     BP 12/10/20 1352 128/78     Pulse Rate 12/10/20 1352 78     Resp 12/10/20 1352 18     Temp 12/10/20 1352 98 F (36.7 C)     Temp Source 12/10/20 1352 Oral     SpO2 12/10/20 1352 98 %     Weight 12/10/20 1225 175 lb 0.7 oz (79.4 kg)     Height 12/10/20 1349 5\' 6"  (1.676 m)     Head Circumference --      Peak Flow --      Pain Score 12/10/20 1352 3     Pain Loc --      Pain Edu? --      Excl. in GC? --     Constitutional: Alert and oriented. Well appearing and in no acute distress. Eyes: Conjunctivae are normal. PERRL. EOMI. Head: Atraumatic. Neck: No stridor.   Cardiovascular: Normal rate, regular rhythm. Grossly normal heart sounds.  Good peripheral circulation. Respiratory: Normal respiratory  effort.  No retractions. Lungs CTAB. Musculoskeletal: Full range of motion of the bilateral ankle.  Dorsal pedal pulse 2+.  Normal cap refill. Neurologic:  Normal speech and language. No gross focal neurologic deficits are appreciated. No gait instability. Skin: There is a single 2 cm x 2 cm lesion to the medial right foot at the base of the first MTP with superficial desquamation and scattered punctate erythematous macular rash.  There are scattered punctate lesions noted to the bottom of the left foot as well.  There is no rash to the palms of the bilateral hands.  There is some scattered desquamation at various points of the soles of the bilateral feet. Psychiatric: Mood and affect are normal. Speech and behavior are  normal.   ____________________________________________   INITIAL IMPRESSION / ASSESSMENT AND PLAN / ED COURSE  As part of my medical decision making, I reviewed the following data within the electronic MEDICAL RECORD NUMBER Nursing notes reviewed and incorporated and Notes from prior ED visits        Patient is a 35 year old male who presents to the emergency department for evaluation of rash to the bilateral.  See HPI for further details.  Physical exam, the patient does have a rash that is macular in nature with various erythematous punctate lesions across the bottoms of both feet.  There are also scattered areas of desquamation and some between the toes.  Per the patient, this worsened after wearing wet socks and shoes at a shift at work.  He was tested for syphilis in December, endorses new sexual partner since that time.  He does not have any rash noted on the bilateral hands and did not have any preceding penile lesion that he was aware of.  Differentials considered for the patient include secondary syphilis, tinea pedis, staph or other skin infection with desquamation.  Will cover the patient with penicillin G to cover for any potential syphilis given high risk with this infection.  Also recommended over-the-counter Lotrimin for suspected tinea pedis in between the toes and on the soles of the feet.  Encouraged wearing dry socks and shoes when at work.  The patient should return with any acute worsening, or otherwise follow[facility].  Patient is amenable with this plan is stable at this time for outpatient therapy.      ____________________________________________   FINAL CLINICAL IMPRESSION(S) / ED DIAGNOSES  Final diagnoses:  Rash of foot  Tinea pedis of both feet  Possible exposure to STD     ED Discharge Orders    None      *Please note:  Jaden L Breault was evaluated in Emergency Department on 12/10/2020 for the symptoms described in the history of present  illness. He was evaluated in the context of the global COVID-19 pandemic, which necessitated consideration that the patient might be at risk for infection with the SARS-CoV-2 virus that causes COVID-19. Institutional protocols and algorithms that pertain to the evaluation of patients at risk for COVID-19 are in a state of rapid change based on information released by regulatory bodies including the CDC and federal and state organizations. These policies and algorithms were followed during the patient's care in the ED.  Some ED evaluations and interventions may be delayed as a result of limited staffing during and the pandemic.*   Note:  This document was prepared using Dragon voice recognition software and may include unintentional dictation errors.   Lucy Chris, PA 12/10/20 1813    Sharman Cheek, MD  12/11/20 1505  

## 2020-12-10 NOTE — Discharge Instructions (Signed)
You can purchase Lotrimin over the counter as a cream for your feet. Keep your feet dry as much as possible. [facility] if any continued problems. Return to ER for any worsening.

## 2022-04-08 ENCOUNTER — Encounter: Payer: Self-pay | Admitting: Emergency Medicine

## 2022-04-08 ENCOUNTER — Ambulatory Visit
Admission: EM | Admit: 2022-04-08 | Discharge: 2022-04-08 | Disposition: A | Discharge status: Home / Self Care | Payer: Self-pay | Attending: Physician Assistant | Admitting: Physician Assistant

## 2022-04-08 DIAGNOSIS — Z23 Encounter for immunization: Secondary | ICD-10-CM

## 2022-04-08 DIAGNOSIS — S61216A Laceration without foreign body of right little finger without damage to nail, initial encounter: Secondary | ICD-10-CM

## 2022-04-08 MED ORDER — TETANUS-DIPHTHERIA TOXOIDS TD 5-2 LFU IM INJ
0.5000 mL | INJECTION | Freq: Once | INTRAMUSCULAR | Status: AC
  Administered 2022-04-08: 0.5 mL via INTRAMUSCULAR

## 2022-04-08 MED ORDER — AMOXICILLIN-POT CLAVULANATE 875-125 MG PO TABS: 1.0000 | ORAL_TABLET | Freq: Two times a day (BID) | ORAL | 0 refills | Status: AC

## 2022-04-08 NOTE — Discharge Instructions (Addendum)
-  I have placed 12 sutures in your little finger.  We have also given you a splint to wear so that you do not bend your finger and ripped the stitches. -We have updated your tetanus today.  Those are generally good for 10 years. - Do not get the area wet for 48 hours and then clean gently with soap and water every day and change your bandage daily.  Wear the splint until the stitches come out. - I am putting you on antibiotics to try to prevent infection but if you notice increased redness, swelling or pustular drainage from the site, please return to our office for reevaluation. - Ibuprofen or Tylenol for pain relief. - Return in 7 to 10 days for removal of sutures or sooner for any signs of infection.

## 2022-04-08 NOTE — ED Provider Notes (Addendum)
[facility]    CSN: [id number] Arrival date & time: 04/08/22  1023      History   Chief Complaint Chief Complaint  Patient presents with   Laceration    HPI Ryan Frye is a 36 y.o. male presenting for a laceration of the right fifth digit, palmar aspect that occurred immediately prior to arrival.  Says he was changing a tire at home and when he removed his hand he noticed it was bleeding and was cut.  He has no idea what cut him.  He did not clean the wound before arrival.  He denies any numbness or weakness of the finger.  No significant pain.  Full range of motion of the finger.  He is still having significant bleeding despite holding pressure on the finger.  He is concerned because he saw a white area which she thought could be bone.  He does not know when his last tetanus immunization was.  He has no other complaints.  HPI  Past Medical History:  Diagnosis Date   Congenital heart defect     There are no problems to display for this patient.   Past Surgical History:  Procedure Laterality Date   open heart surgery         Home Medications    Prior to Admission medications   Medication Sig Start Date End Date Taking? Authorizing Provider  amoxicillin-clavulanate (AUGMENTIN) 875-125 MG tablet Take 1 tablet by mouth every 12 (twelve) hours for 7 days. 04/08/22 04/15/22 Yes Shirlee Latch, PA-C    Family History Family History  Problem Relation Age of Onset   Healthy Mother     Social History Social History   Tobacco Use   Smoking status: Every Day    Packs/day: 0.50    Types: Cigarettes   Smokeless tobacco: Never  Vaping Use   Vaping Use: Never used  Substance Use Topics   Alcohol use: Yes    Comment: occasionally   Drug use: Yes    Types: Marijuana     Allergies   Patient has no known allergies.   Review of Systems Review of Systems  Musculoskeletal:  Negative for arthralgias and joint swelling.  Skin:  Positive for wound.   Neurological:  Negative for weakness and numbness.    Physical Exam Triage Vital Signs ED Triage Vitals  Enc Vitals Group     BP      Pulse      Resp      Temp      Temp src      SpO2      Weight      Height      Head Circumference      Peak Flow      Pain Score      Pain Loc      Pain Edu?      Excl. in GC?    No data found.  Updated Vital Signs BP 138/80 (BP Location: Left Arm)   Pulse 87   Temp 98.7 F (37.1 C) (Oral)   Resp 18   Ht 5\' 6"  (1.676 m)   Wt 155 lb (70.3 kg)   SpO2 99%   BMI 25.02 kg/m    Physical Exam Vitals and nursing note reviewed.  Constitutional:      General: He is not in acute distress.    Appearance: Normal appearance. He is well-developed. He is not ill-appearing.  HENT:     Head: Normocephalic and atraumatic.  Eyes:     General: No scleral icterus.    Conjunctiva/sclera: Conjunctivae normal.  Cardiovascular:     Rate and Rhythm: Normal rate.     Pulses: Normal pulses.  Pulmonary:     Effort: Pulmonary effort is normal. No respiratory distress.  Abdominal:     Palpations: Abdomen is soft.  Musculoskeletal:     Cervical back: Neck supple.  Skin:    General: Skin is warm and dry.     Capillary Refill: Capillary refill takes less than 2 seconds.     Comments: 4 cm laceration palmar right 5th digit with exposed subcutaneous tissue. Persistent bleeding, but no spurting. Full ROM of finger and able to resist against me with extension and flexion. Good pulses and cap refill normal.  Neurological:     General: No focal deficit present.     Mental Status: He is alert. Mental status is at baseline.     Motor: No weakness.  Psychiatric:        Mood and Affect: Mood normal.        Behavior: Behavior normal.     UC Treatments / Results  Labs (all labs ordered are listed, but only abnormal results are displayed) Labs Reviewed - No data to display  EKG   Radiology No results found.  Procedures Laceration  Repair  Date/Time: 04/08/2022 12:03 PM  Performed by: Shirlee Latch, PA-C Authorized by: Shirlee Latch, PA-C   Consent:    Consent obtained:  Verbal   Consent given by:  Patient   Risks discussed:  Infection, pain, retained foreign body, poor cosmetic result and poor wound healing Universal protocol:    Patient identity confirmed:  Verbally with patient Anesthesia:    Anesthesia method:  Local infiltration   Local anesthetic:  Lidocaine 1% w/o epi Laceration details:    Location:  Finger   Finger location:  R small finger   Length (cm):  4 Exploration:    Hemostasis achieved with:  Direct pressure and tourniquet   Contaminated: no   Treatment:    Area cleansed with:  Saline   Amount of cleaning:  Extensive   Irrigation solution:  Sterile saline   Visualized foreign bodies/material removed: no     Debridement:  Minimal   Undermining:  Minimal Skin repair:    Repair method:  Sutures   Suture size:  4-0   Suture technique:  Simple interrupted   Number of sutures:  12 Approximation:    Approximation:  Close Repair type:    Repair type:  Intermediate Post-procedure details:    Dressing:  Sterile dressing   Procedure completion:  Tolerated well, no immediate complications  (including critical care time)  Medications Ordered in UC Medications  tetanus & diphtheria toxoids (adult) (TENIVAC) injection 0.5 mL (0.5 mLs Intramuscular Given 04/08/22 1102)    Initial Impression / Assessment and Plan / UC Course  I have reviewed the triage vital signs and the nursing notes.  Pertinent labs & imaging results that were available during my care of the patient were reviewed by me and considered in my medical decision making (see chart for details).  35 year old male presenting for 4 cm laceration of the palmar aspect of the right fifth digit that occurred immediately prior to arrival.  Unsure what cut him.  Persistent bleeding despite holding pressure.  No numbness or weakness in  full range of motion of finger.  Not up-to-date with tetanus.  Patient gives consent for laceration repair.  Wound heavily  irrigated with sterile saline.  Placed 12 simple sutures in the finger.  I did have to use a finger tourniquet multiple times to help stop the bleeding while suturing.  Suture repair took 30 to 40 minutes.  Patient tolerated well.  Area cleansed lightly with saline and applied a nonstick dressing and finger splint.  Reviewed RICE guidelines.  Placing patient on Augmentin for prophylactic infection prevention.  Advised him to monitor situation, change bandage regularly and reviewed wound care.  Advise following up if any signs of infection.  Reviewed returning in 7 to 10 days for suture removal or sooner for signs of infection or other problems.   Final Clinical Impressions(s) / UC Diagnoses   Final diagnoses:  Laceration of right little finger without foreign body without damage to nail, initial encounter     Discharge Instructions      -I have placed 12 sutures in your little finger.  We have also given you a splint to wear so that you do not bend your finger and ripped the stitches. -We have updated your tetanus today.  Those are generally good for 10 years. - Do not get the area wet for 48 hours and then clean gently with soap and water every day and change your bandage daily.  Wear the splint until the stitches come out. - I am putting you on antibiotics to try to prevent infection but if you notice increased redness, swelling or pustular drainage from the site, please return to our office for reevaluation. - Ibuprofen or Tylenol for pain relief. - Return in 7 to 10 days for removal of sutures or sooner for any signs of infection.     ED Prescriptions     Medication Sig Dispense Auth. Provider   amoxicillin-clavulanate (AUGMENTIN) 875-125 MG tablet Take 1 tablet by mouth every 12 (twelve) hours for 7 days. 14 tablet Gareth MorganEaves, Dorenda Pfannenstiel B, PA-C      PDMP not reviewed  this encounter.   Shirlee Latchaves, Rehan Holness B, PA-C 04/08/22 1205    Eusebio FriendlyEaves, Elgin Carn B, PA-C 04/11/22 [phone]

## 2022-04-08 NOTE — ED Triage Notes (Signed)
Patient states that he was changing a tire and cut his right pinky finger.  Patient states he seen the "bone" in finger.  Patient unsure of last Tdap.

## 2022-04-15 ENCOUNTER — Ambulatory Visit
Admission: RE | Admit: 2022-04-15 | Discharge: 2022-04-15 | Disposition: A | Discharge status: Home / Self Care | Payer: Self-pay | Source: Ambulatory Visit

## 2022-04-15 DIAGNOSIS — Z4802 Encounter for removal of sutures: Secondary | ICD-10-CM

## 2022-04-15 DIAGNOSIS — S61216D Laceration without foreign body of right little finger without damage to nail, subsequent encounter: Secondary | ICD-10-CM

## 2022-04-15 NOTE — ED Triage Notes (Signed)
Pt presents for suture removal on right little finger.
